# Patient Record
Sex: Female | Born: 1995 | Race: Black or African American | Hispanic: No | Marital: Single | Smoking: Never smoker
Health system: Southern US, Community
[De-identification: ages and names within clinical notes are randomized; demographics above are authoritative.]

## PROBLEM LIST (undated history)

## (undated) DIAGNOSIS — M419 Scoliosis, unspecified: Secondary | ICD-10-CM

---
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
## 2017-12-24 IMAGING — CT CT ABD-PELV W/ CM
2 of 4 series · 17 of 46 positions shown, 19 images · IV contrast (ISOVUE)
Comparison: None.

CLINICAL DATA: Upper abdominal pain after lifting injury last week.

EXAM:
CT ABDOMEN AND PELVIS WITH CONTRAST
TECHNIQUE: Multidetector CT imaging of the abdomen and pelvis was performed
using the standard protocol following bolus administration of
intravenous contrast.
CONTRAST:  100mL HDP1SU-QHH IOPAMIDOL (HDP1SU-QHH) INJECTION 61%

[Series 2: abd/pel with · axial · 0.61mm/px · z∈[-412,-62]mm · 14 of 78 slices shown, 16 images]
[im 4/78  soft-tissue]
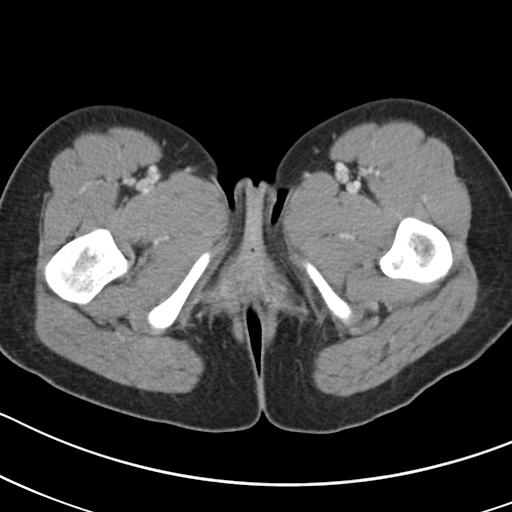
[im 4/78  bone]
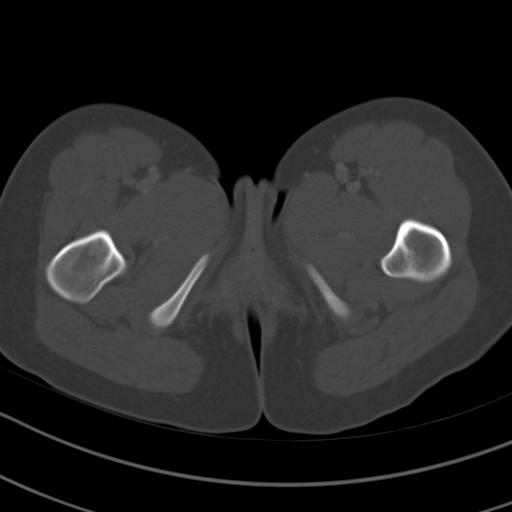
[im 12/78  soft-tissue]
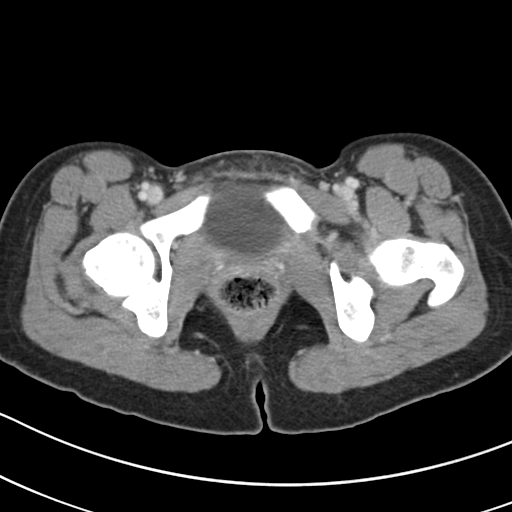
[im 16/78  soft-tissue]
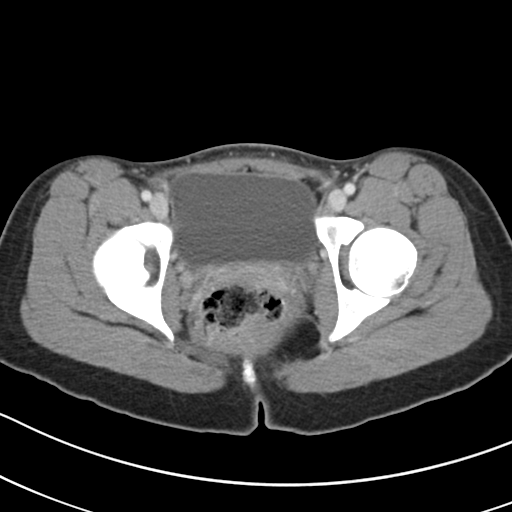
[im 20/78  soft-tissue]
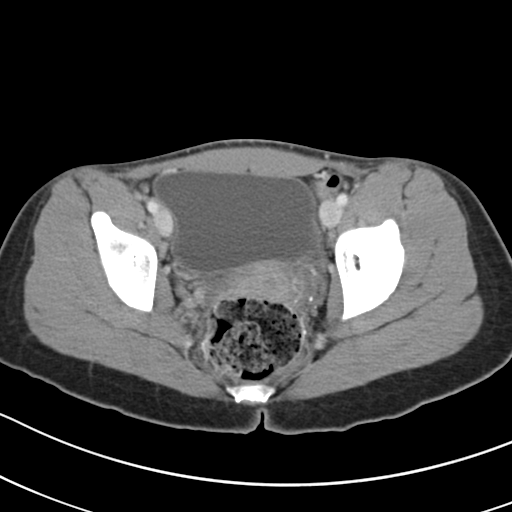
[im 27/78  soft-tissue]
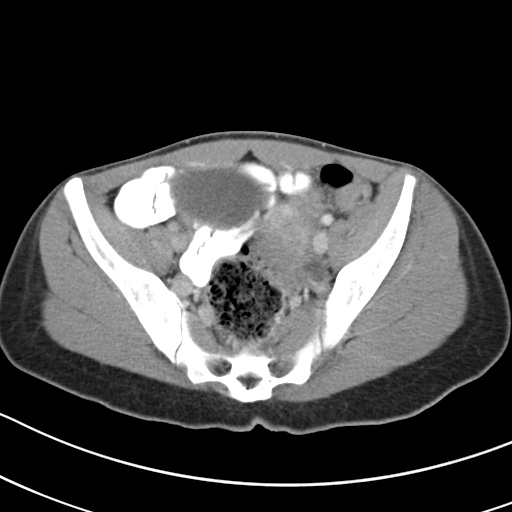
[im 31/78  soft-tissue]
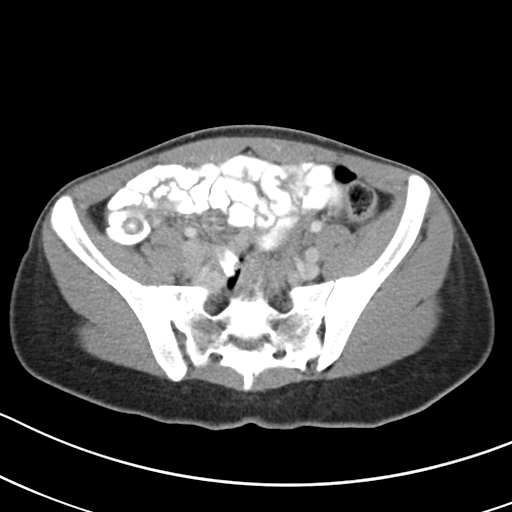
[im 35/78  soft-tissue]
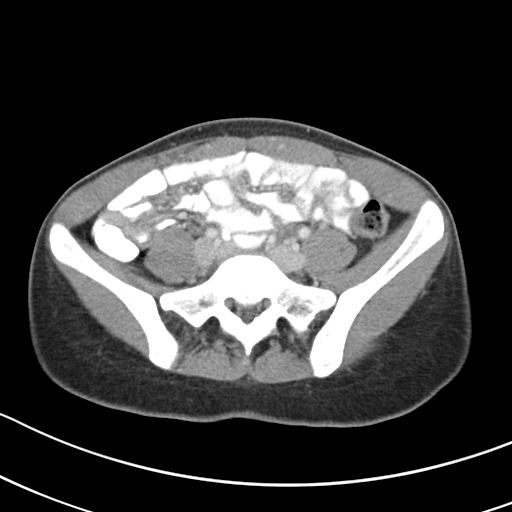
[im 43/78  soft-tissue]
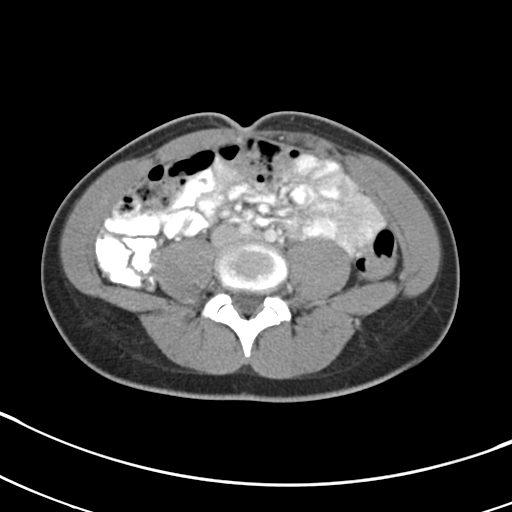
[im 47/78  soft-tissue]
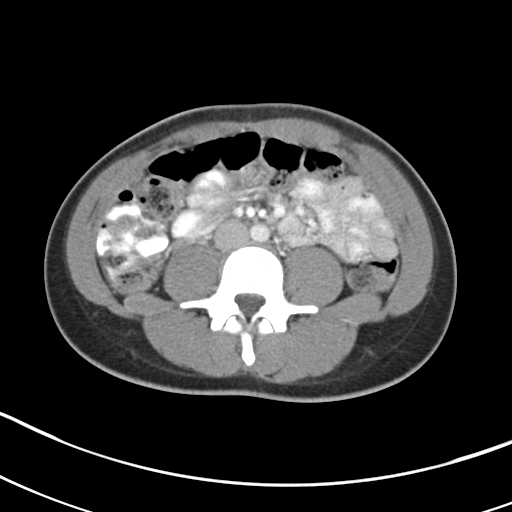
[im 47/78  bone]
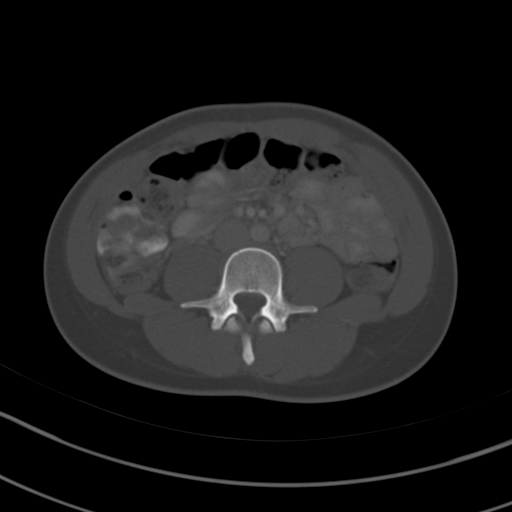
[im 51/78  soft-tissue]
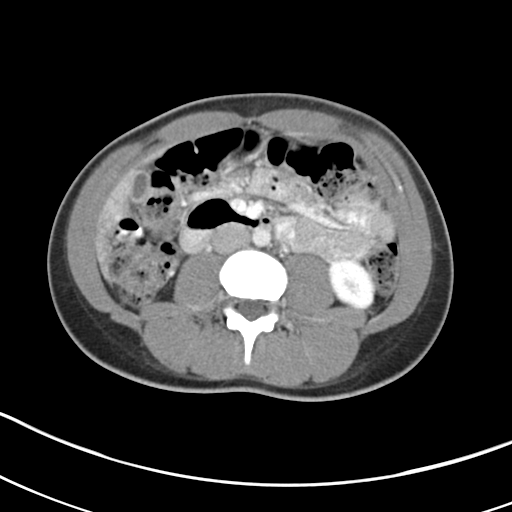
[im 58/78  soft-tissue]
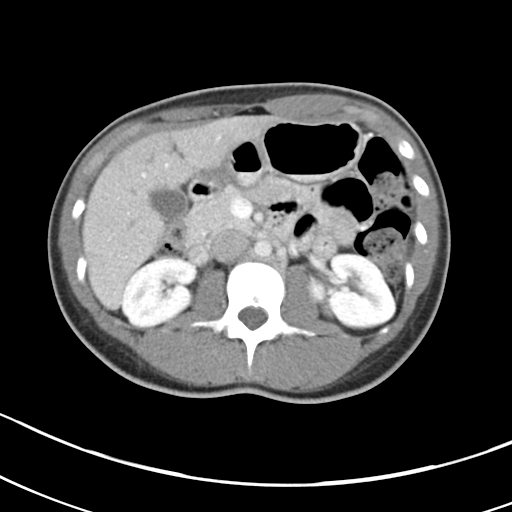
[im 62/78  soft-tissue]
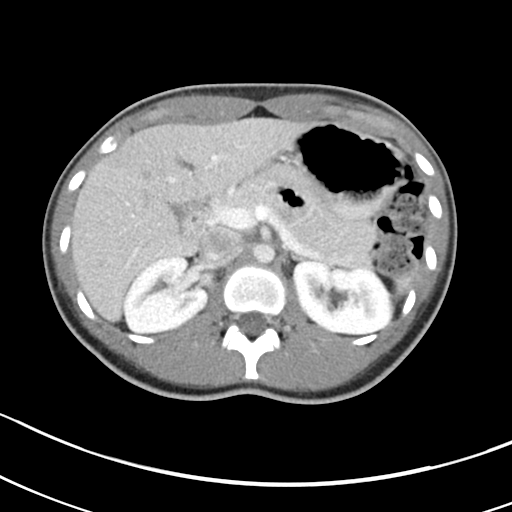
[im 66/78  soft-tissue]
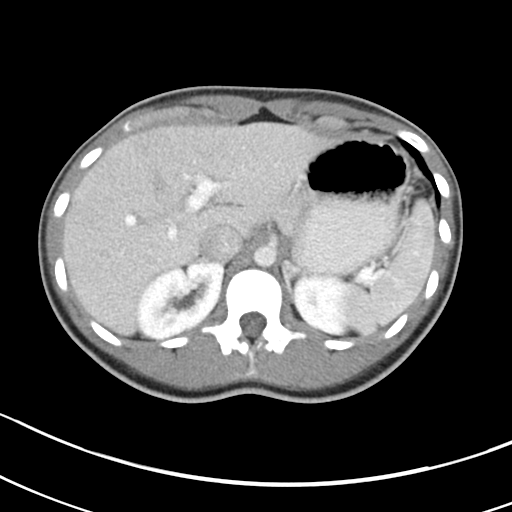
[im 74/78  soft-tissue]
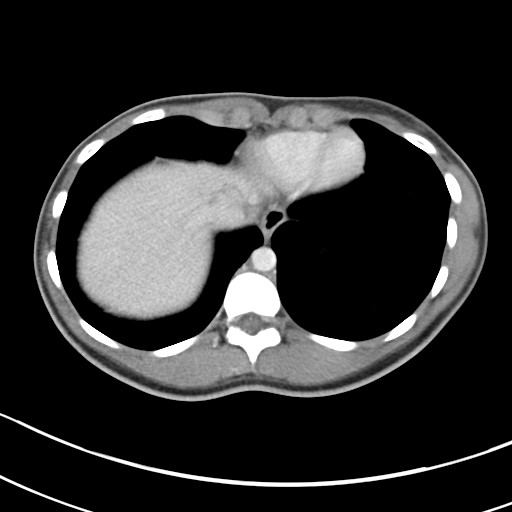

[Series 3: coronal a/|p · coronal · 0.69mm/px · 3 of 92 slices shown]
[im 31/92  soft-tissue]
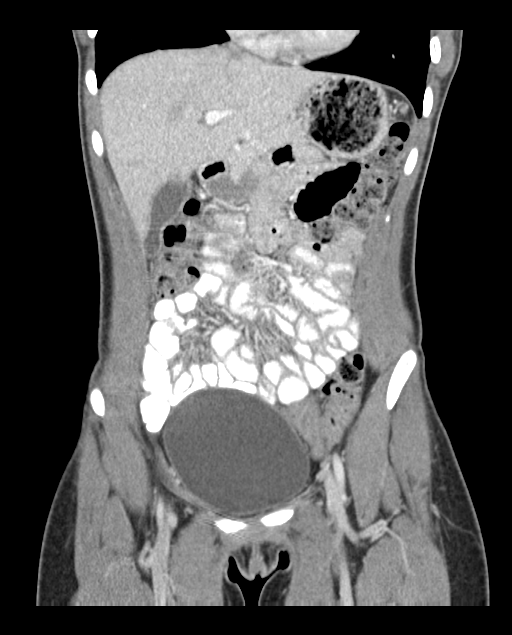
[im 41/92  soft-tissue]
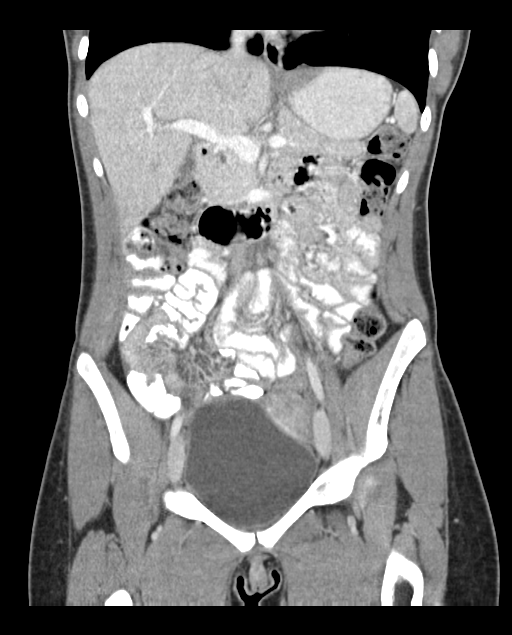
[im 51/92  soft-tissue]
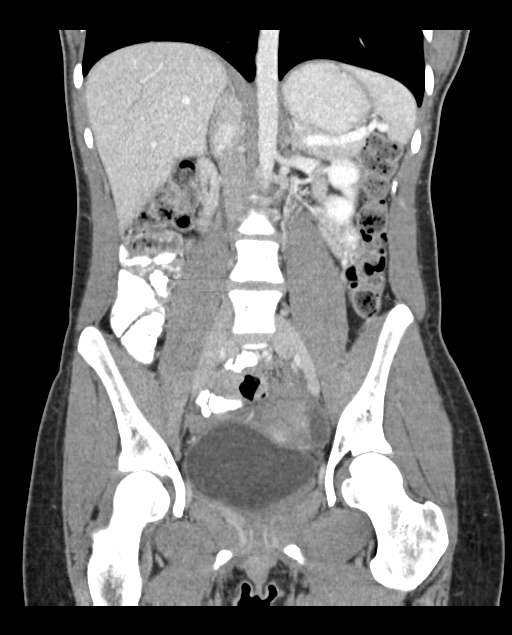

[17 of 46 positions shown; findings below may reference images not displayed]

FINDINGS: Lower chest: Normal

Hepatobiliary: Hepatic parenchyma is normal. No calcified
gallstones.

Pancreas: Normal

Spleen: Normal

Adrenals/Urinary Tract: Adrenal glands are normal. Kidneys are
normal. No cyst, mass, stone or hydronephrosis.

Stomach/Bowel: No abnormal bowel finding. No evidence of ileus or
obstruction. No inflammatory changes. Appendix is normal.

Vascular/Lymphatic: Normal

Reproductive: Normal

Other: No free fluid or air.

Musculoskeletal: Normal
IMPRESSION: Normal CT scan.  No cause of the presenting symptoms is identified.

## 2018-06-26 ENCOUNTER — Other Ambulatory Visit: Payer: Self-pay

## 2018-06-26 ENCOUNTER — Ambulatory Visit (HOSPITAL_COMMUNITY)
Admission: EM | Admit: 2018-06-26 | Discharge: 2018-06-26 | Disposition: A | Discharge status: Home / Self Care | Attending: Family Medicine | Admitting: Family Medicine

## 2018-06-26 ENCOUNTER — Encounter (HOSPITAL_COMMUNITY): Payer: Self-pay | Admitting: Emergency Medicine

## 2018-06-26 DIAGNOSIS — J069 Acute upper respiratory infection, unspecified: Secondary | ICD-10-CM

## 2018-06-26 DIAGNOSIS — R05 Cough: Secondary | ICD-10-CM

## 2018-06-26 DIAGNOSIS — B9789 Other viral agents as the cause of diseases classified elsewhere: Secondary | ICD-10-CM | POA: Diagnosis not present

## 2018-06-26 MED ORDER — CETIRIZINE-PSEUDOEPHEDRINE ER 5-120 MG PO TB12: 1.0000 | ORAL_TABLET | Freq: Every day | ORAL | 0 refills | Status: AC

## 2018-06-26 MED ORDER — FLUTICASONE PROPIONATE 50 MCG/ACT NA SUSP: 2.0000 | Freq: Every day | NASAL | 0 refills | Status: AC

## 2018-06-26 MED ORDER — BENZONATATE 100 MG PO CAPS: 100.0000 mg | ORAL_CAPSULE | Freq: Three times a day (TID) | ORAL | 0 refills | Status: AC

## 2018-06-26 NOTE — ED Provider Notes (Signed)
[facility]   [id number] 06/26/18 Arrival Time: 1712   CC: URI symptoms   SUBJECTIVE: History from: patient.  Rosamary Boudreau is a 22 y.o. female who presents with abrupt onset of ear pressure, sinus pain/pressure, runny nose congestion, and dry cough x 4 days.  Denies positive sick exposure or precipitating event.  Has tried advil sinus without relief.  Denies aggravating factors.  Reports previous symptoms in the past and diagnosed with flu. Complains of associated fatigue, body aches, and nausea.  Denies fever, chills, sore throat, SOB, wheezing, chest pain, nausea, changes in bowel or bladder habits.    Received flu shot this year: yes.  ROS: As per HPI.  Past Medical History:  Diagnosis Date  . Scoliosis    History reviewed. No pertinent surgical history. Allergies  Allergen Reactions  . Miconazole Nitrate Swelling    Swelling to vagina.  Swelling to vagina.     No current facility-administered medications on file prior to encounter.    Current Outpatient Medications on File Prior to Encounter  Medication Sig Dispense Refill  . Etonogestrel (NEXPLANON [place]) Inject into the skin.    Marland Kitchen ibuprofen (ADVIL,MOTRIN) 800 MG tablet Take 1 tablet (800 mg total) by mouth every 8 (eight) hours as needed. 21 tablet 0  . Multiple Vitamin (MULTIVITAMIN WITH MINERALS) TABS tablet Take 1 tablet by mouth daily.    Marland Kitchen docusate sodium (COLACE) 100 MG capsule Take 1 capsule (100 mg total) by mouth every 12 (twelve) hours. 60 capsule 0   Social History   Socioeconomic History  . Marital status: Single    Spouse name: Not on file  . Number of children: Not on file  . Years of education: Not on file  . Highest education level: Not on file  Occupational History  . Not on file  Social Needs  . Financial resource strain: Not on file  . Food insecurity:    Worry: Not on file    Inability: Not on file  . Transportation needs:    Medical: Not on file    Non-medical: Not on file    Tobacco Use  . Smoking status: Never Smoker  . Smokeless tobacco: Never Used  Substance and Sexual Activity  . Alcohol use: No  . Drug use: Not on file  . Sexual activity: Not on file  Lifestyle  . Physical activity:    Days per week: Not on file    Minutes per session: Not on file  . Stress: Not on file  Relationships  . Social connections:    Talks on phone: Not on file    Gets together: Not on file    Attends religious service: Not on file    Active member of club or organization: Not on file    Attends meetings of clubs or organizations: Not on file    Relationship status: Not on file  . Intimate partner violence:    Fear of current or ex partner: Not on file    Emotionally abused: Not on file    Physically abused: Not on file    Forced sexual activity: Not on file  Other Topics Concern  . Not on file  Social History Narrative  . Not on file   History reviewed. No pertinent family history.  OBJECTIVE:  Vitals:   06/26/18 1808  BP: 110/65  Pulse: 76  Temp: 98.6 F (37 C)  TempSrc: Oral  SpO2: 100%     General appearance: alert; appears fatigued, but nontoxic; speaking  in full sentences and tolerating own secretions HEENT: NCAT; Ears: EACs clear, TMs pearly gray; Eyes: PERRL.  EOM grossly intact. Sinuses: nontender; Nose: nares erythematous but patent with mild rhinorrhea, Throat: oropharynx clear, tonsils non erythematous or enlarged, uvula midline  Neck: supple without LAD Lungs: unlabored respirations, symmetrical air entry; cough: absent; no respiratory distress; CTAB Heart: regular rate and rhythm.  Radial pulses 2+ symmetrical bilaterally Skin: warm and dry Psychological: alert and cooperative; normal mood and affect  ASSESSMENT & PLAN:  1. Viral URI with cough     Meds ordered this encounter  Medications  . cetirizine-pseudoephedrine (ZYRTEC-D) 5-120 MG tablet    Sig: Take 1 tablet by mouth daily.    Dispense:  30 tablet    Refill:  0    Order  Specific Question:   Supervising Provider    Answer:   MURRAY, LAURA WILSON [phone][[id number]]  . fluticasone (FLONASE)Isa Rankin 50 MCG/ACT nasal spray    Sig: Place 2 sprays into both nostrils daily.    Dispense:  16 g    Refill:  0    Order Specific Question:   Supervising Provider    Answer:   Isa RankinMURRAY, LAURA WILSON [phone][[id number]]  . benzonatate (TESSALON) 100 MG capsule    Sig: Take 1 capsule (100 mg total) by mouth every 8 (eight) hours.    Dispense:  21 capsule    Refill:  0    Order Specific Question:   Supervising Provider    Answer:   Isa RankinMURRAY, LAURA WILSON [[id number]]   Get plenty of rest and push fluids Tessalon Perles prescribed for cough Zyrtec-D prescribed for nasal congestion, runny nose, and/or sore throat Flonase prescribed for nasal congestion and runny nose Use medications daily for symptom relief Use OTC medications like ibuprofen or tylenol as needed fever or pain Follow up with PCP if symptoms persist Return or go to ER if you have any new or worsening symptoms fever, chills, nausea, vomiting, chest pain, cough, shortness of breath, wheezing, abdominal pain, changes in bowel or bladder habits, etc...  Reviewed expectations re: course of current medical issues. Questions answered. Outlined signs and symptoms indicating need for more acute intervention. Patient verbalized understanding. After Visit Summary given.         Rennis HardingWurst, Fitzpatrick Alberico, PA-C 06/26/18 1925

## 2018-06-26 NOTE — ED Triage Notes (Signed)
Pt reports headache, body aches, cough, and congestion x4 days.  She does not have a recorded fever at home.

## 2018-06-26 NOTE — Discharge Instructions (Addendum)
Get plenty of rest and push fluids °Tessalon Perles prescribed for cough °Zyrtec-D prescribed for nasal congestion, runny nose, and/or sore throat °Flonase prescribed for nasal congestion and runny nose °Use medications daily for symptom relief °Use OTC medications like ibuprofen or tylenol as needed fever or pain °Follow up with PCP if symptoms persist °Return or go to ER if you have any new or worsening symptoms fever, chills, nausea, vomiting, chest pain, cough, shortness of breath, wheezing, abdominal pain, changes in bowel or bladder habits, etc... °

## 2018-06-28 ENCOUNTER — Ambulatory Visit (HOSPITAL_COMMUNITY)
Admission: RE | Admit: 2018-06-28 | Discharge: 2018-06-28 | Disposition: A | Discharge status: Home / Self Care | Attending: Psychiatry | Admitting: Psychiatry

## 2018-06-28 DIAGNOSIS — F332 Major depressive disorder, recurrent severe without psychotic features: Secondary | ICD-10-CM | POA: Diagnosis present

## 2018-06-28 DIAGNOSIS — Z888 Allergy status to other drugs, medicaments and biological substances status: Secondary | ICD-10-CM | POA: Diagnosis not present

## 2018-06-28 NOTE — BH Assessment (Addendum)
Assessment Note  Rebecca Clayton is an 22 y.o. female.  The pt came in after being referred by [facility].  The pt stated she was referred to start medication. The pt denies any recent of current SI.  She stated she had SI last year and the thoughts went away.  She denies any suicide attempts in the past.  The pt reported she is isolating herself, has guilt, and feels worthless.  She stated she is depressed about school and nervous about how her parents will feel being at [facility], since she is using their insurance.  The pt is seeing a counselor, [employer].  She is not seeing a psychiatrist currently.  She has not been on meds in the past.   The pt lives in a dorm with her roommate. The pt reports she gets along well with her roommate.  The pt denies self harm, HI, legal issues, problems with abuse.  The pt stated she is sleeping about 3 hours a night and has a poor appetite.  She denies SA.  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  She stated she usually makes mostly A and she is now making B's.  Pt is dressed in casual clothes. She is alert and oriented x4. Pt speaks in a clear tone, at moderate volume and normal pace. Eye contact is good. Pt's mood is pleasant. Thought process is coherent and relevant. There is no indication Pt is currently responding to internal stimuli or experiencing delusional thought content.?Pt was cooperative throughout assessment.    Diagnosis: F33.2 Major depressive disorder, Recurrent episode, Severe  Past Medical History:  Past Medical History:  Diagnosis Date  . Scoliosis     No past surgical history on file.  Family History: No family history on file.  Social History:  reports that she has never smoked. She has never used smokeless tobacco. She reports that she does not drink alcohol. Her drug history is not on file.  Additional Social History:  Alcohol / Drug Use Pain Medications: See MAR Prescriptions: See MAR Over the Counter: See MAR History of alcohol /  drug use?: No history of alcohol / drug abuse Longest period of sobriety (when/how long): NA  CIWA: CIWA-Ar BP: 113/89 Pulse Rate: 76 COWS:    Allergies:  Allergies  Allergen Reactions  . Miconazole Nitrate Swelling    Swelling to vagina.  Swelling to vagina.      Home Medications:  (Not in a hospital admission)  OB/GYN Status:  No LMP recorded. Patient has had an implant.  General Assessment Data Location of Assessment: [facility] Assessment Services TTS Assessment: In system Is this a Tele or Face-to-Face Assessment?: Face-to-Face Is this an Initial Assessment or a Re-assessment for this encounter?: Initial Assessment Patient Accompanied by:: N/A Language Other than English: No Living Arrangements: Other (Comment)(dorm) What gender do you identify as?: Female Marital status: Single Maiden name: Gwynn Pregnancy Status: No Living Arrangements: Non-relatives/Friends Can pt return to current living arrangement?: Yes Admission Status: Voluntary Is patient capable of signing voluntary admission?: Yes Referral Source: Other([facility]) Insurance type: Tricare     Crisis Care Plan Living Arrangements: Non-relatives/Friends Legal Guardian: Other:(self) Name of Psychiatrist: none Name of Therapist: Adelene Amas  Education Status Is patient currently in school?: Yes Current Grade: Junior in college Highest grade of school patient has completed: sophmore in college Name of school: [employer] person: NA IEP information if applicable: NA  Risk to self with the past 6 months Suicidal Ideation: No  Has patient been a risk to self within the past 6 months prior to admission? : No Suicidal Intent: No Has patient had any suicidal intent within the past 6 months prior to admission? : No Is patient at risk for suicide?: No Suicidal Plan?: No Has patient had any suicidal plan within the past 6 months prior to admission? : No Access to Means: No What has been your use of  drugs/alcohol within the last 12 months?: none Previous Attempts/Gestures: No How many times?: 0 Other Self Harm Risks: none Triggers for Past Attempts: None known Intentional Self Injurious Behavior: None Family Suicide History: No Recent stressful life event(s): Other (Comment)(stressed about school) Persecutory voices/beliefs?: No Depression: Yes Depression Symptoms: Despondent, Insomnia, Tearfulness, Isolating, Loss of interest in usual pleasures, Feeling worthless/self pity Substance abuse history and/or treatment for substance abuse?: No Suicide prevention information given to non-admitted patients: Yes  Risk to Others within the past 6 months Homicidal Ideation: No Does patient have any lifetime risk of violence toward others beyond the six months prior to admission? : No Thoughts of Harm to Others: No Current Homicidal Intent: No Current Homicidal Plan: No Access to Homicidal Means: No Identified Victim: none History of harm to others?: No Assessment of Violence: None Noted Violent Behavior Description: none Does patient have access to weapons?: No Criminal Charges Pending?: No Does patient have a court date: No Is patient on probation?: No  Psychosis Hallucinations: None noted Delusions: None noted  Mental Status Report Appearance/Hygiene: Unremarkable Eye Contact: Good Motor Activity: Freedom of movement Speech: Logical/coherent Level of Consciousness: Alert Mood: Pleasant Affect: Appropriate to circumstance Anxiety Level: None Thought Processes: Coherent, Relevant Judgement: Partial Orientation: Person, Place, Time, Situation Obsessive Compulsive Thoughts/Behaviors: None  Cognitive Functioning Concentration: Normal Memory: Recent Intact, Remote Intact Is patient IDD: No Insight: Fair Impulse Control: Fair Appetite: Poor Have you had any weight changes? : No Change Sleep: Decreased Total Hours of Sleep: 3 Vegetative Symptoms: None  ADLScreening  [facility] Assessment Services) Patient's cognitive ability adequate to safely complete daily activities?: Yes Patient able to express need for assistance with ADLs?: Yes Independently performs ADLs?: Yes (appropriate for developmental age)  Prior Inpatient Therapy Prior Inpatient Therapy: No  Prior Outpatient Therapy Prior Outpatient Therapy: Yes Prior Therapy Dates: current Prior Therapy Facilty/Provider(s): Adelene AmasLinda Makinson Reason for Treatment: depression Does patient have an ACCT team?: No Does patient have Intensive In-House Services?  : No Does patient have [facility] services? : No Does patient have P4CC services?: No  ADL Screening (condition at time of admission) Patient's cognitive ability adequate to safely complete daily activities?: Yes Patient able to express need for assistance with ADLs?: Yes Independently performs ADLs?: Yes (appropriate for developmental age)       Abuse/Neglect Assessment (Assessment to be complete while patient is alone) Abuse/Neglect Assessment Can Be Completed: Yes Physical Abuse: Denies Verbal Abuse: Denies Sexual Abuse: Denies Exploitation of patient/patient's resources: Denies Self-Neglect: Denies Values / Beliefs Cultural Requests During Hospitalization: None Spiritual Requests During Hospitalization: None Consults Spiritual Care Consult Needed: No Social Work Consult Needed: No            Disposition:  Disposition Initial Assessment Completed for this Encounter: Yes Disposition of Patient: Discharge Patient refused recommended treatment: No Mode of transportation if patient is discharged?: Car NP [employer] recommends the pt be discharged.  On Site Evaluation by:   Reviewed with Physician:    Ottis StainGarvin, Naveyah Iacovelli Jermaine 06/28/2018 5:41 PM

## 2018-06-28 NOTE — H&P (Signed)
Behavioral Health Medical Screening Exam  Rebecca AgeeJasmine Clayton is an 22 y.o. female.  Total Time spent with patient: 20 minutes  Psychiatric Specialty Exam: Physical Exam  Nursing note and vitals reviewed. Constitutional: She is oriented to person, place, and time. She appears well-developed and well-nourished.  Cardiovascular: Normal rate.  Respiratory: Effort normal.  Musculoskeletal: Normal range of motion.  Neurological: She is alert and oriented to person, place, and time.  Skin: Skin is warm.    Review of Systems  Constitutional: Negative.   HENT: Negative.   Eyes: Negative.   Respiratory: Negative.   Cardiovascular: Negative.   Gastrointestinal: Negative.   Genitourinary: Negative.   Musculoskeletal: Negative.   Skin: Negative.   Neurological: Negative.   Endo/Heme/Allergies: Negative.   Psychiatric/Behavioral: Positive for depression. Negative for hallucinations, substance abuse and suicidal ideas. The patient is nervous/anxious.     Blood pressure 113/89, pulse 76, temperature 98.2 F (36.8 C), resp. rate 16, SpO2 100 %.There is no height or weight on file to calculate BMI.  General Appearance: Casual and Fairly Groomed  Eye Contact:  Good  Speech:  Clear and Coherent and Normal Rate  Volume:  Normal  Mood:  Euthymic  Affect:  Congruent  Thought Process:  Coherent and Descriptions of Associations: Intact  Orientation:  Full (Time, Place, and Person)  Thought Content:  WDL  Suicidal Thoughts:  No  Homicidal Thoughts:  No  Memory:  Immediate;   Good Recent;   Good Remote;   Good  Judgement:  Fair  Insight:  Fair  Psychomotor Activity:  Normal  Concentration: Concentration: Good and Attention Span: Good  Recall:  Good  Fund of Knowledge:Good  Language: Good  Akathisia:  No  Handed:  Right  AIMS (if indicated):     Assets:  Communication Skills Desire for Improvement Financial Resources/Insurance Housing Physical Health Social Support Transportation  Sleep:        Musculoskeletal: Strength & Muscle Tone: within normal limits Gait & Station: normal Patient leans: N/A  Blood pressure 113/89, pulse 76, temperature 98.2 F (36.8 C), resp. rate 16, SpO2 100 %.  Recommendations:  Based on my evaluation the patient does not appear to have an emergency medical condition.  Maryfrances Bunnellravis B Money, FNP 06/28/2018, 5:31 PM
